# Patient Record
Sex: Male | Born: 1995 | Race: White | Hispanic: No | Marital: Single | Smoking: Never smoker
Health system: Southern US, Community
[De-identification: ages and names within clinical notes are randomized; demographics above are authoritative.]

## PROBLEM LIST (undated history)

## (undated) ENCOUNTER — Inpatient Hospital Stay: Admission: EM | Payer: Self-pay | Source: Home / Self Care

## (undated) HISTORY — PX: ADENOIDECTOMY: SUR15

## (undated) HISTORY — PX: CIRCUMCISION: SHX1350

## (undated) HISTORY — PX: TYMPANOSTOMY TUBE PLACEMENT: SHX32

## (undated) HISTORY — PX: TONSILLECTOMY: SUR1361

---
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
## 2016-02-06 DIAGNOSIS — M79671 Pain in right foot: Secondary | ICD-10-CM | POA: Diagnosis not present

## 2016-02-06 DIAGNOSIS — M25571 Pain in right ankle and joints of right foot: Secondary | ICD-10-CM | POA: Diagnosis not present

## 2016-07-23 MED FILL — PANTOPRAZOLE SOD DR 20 MG T: 20 | 90 days supply | Qty: 180 | Fill #0

## 2016-11-15 IMAGING — US US ABDOMEN COMPLETE
1 series · 14 of 25 positions shown · non-contrast
Comparison: None.

CLINICAL DATA: Patient with vomiting and abdominal pain for 2
weeks.

EXAM:
ULTRASOUND ABDOMEN COMPLETE

[Series 1: us abdomen complete · 0.15mm/px · 14 of 93 slices shown]
[im 1/93]
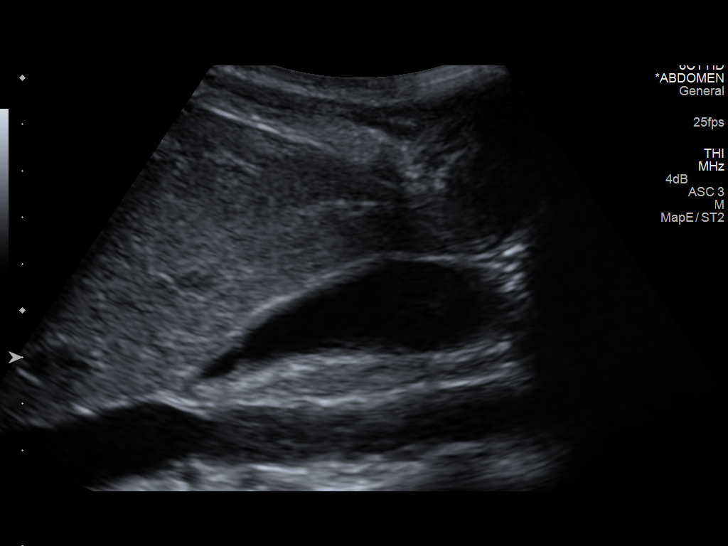
[im 8/93]
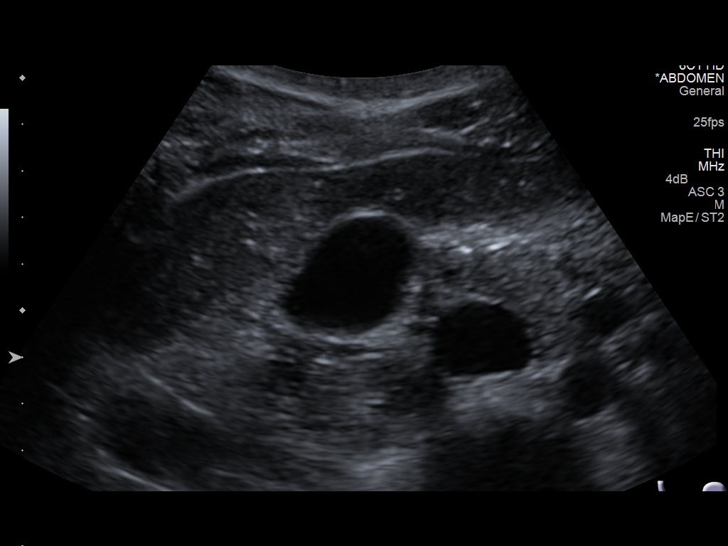
[im 16/93]
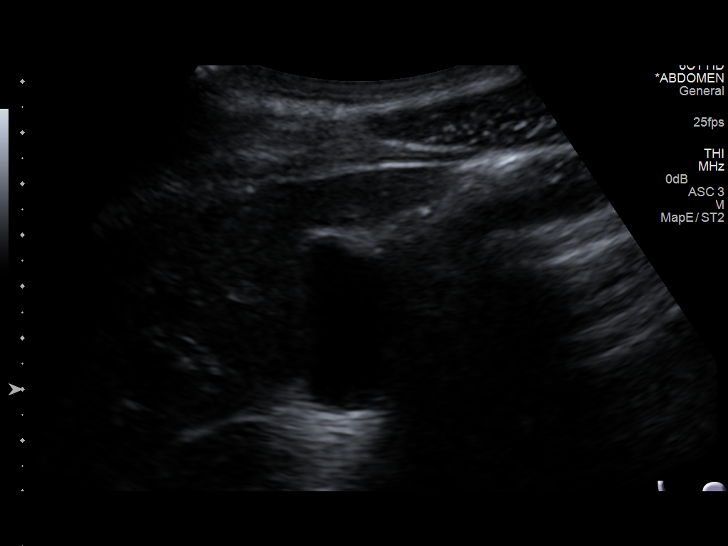
[im 24/93]
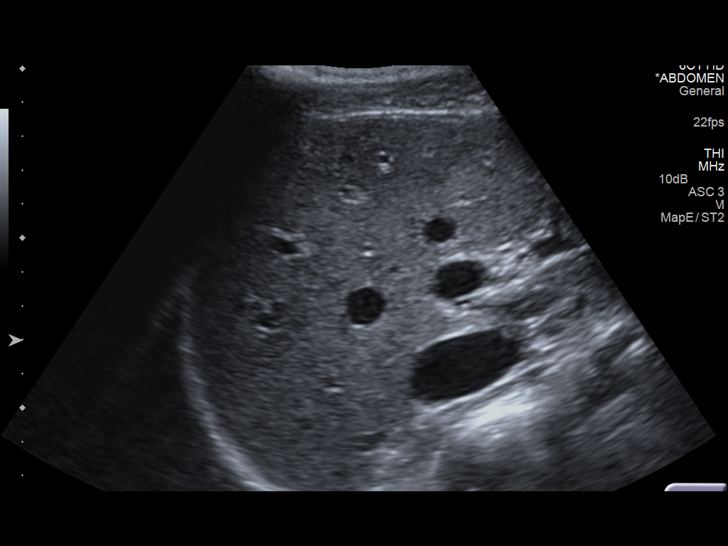
[im 31/93]
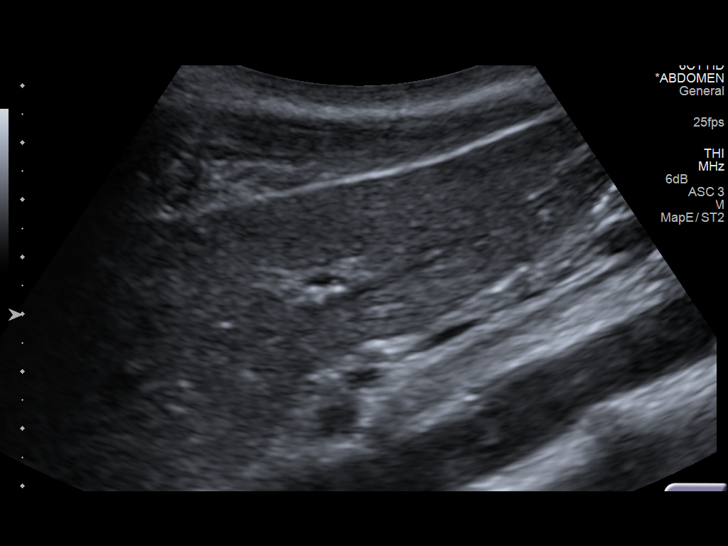
[im 35/93]
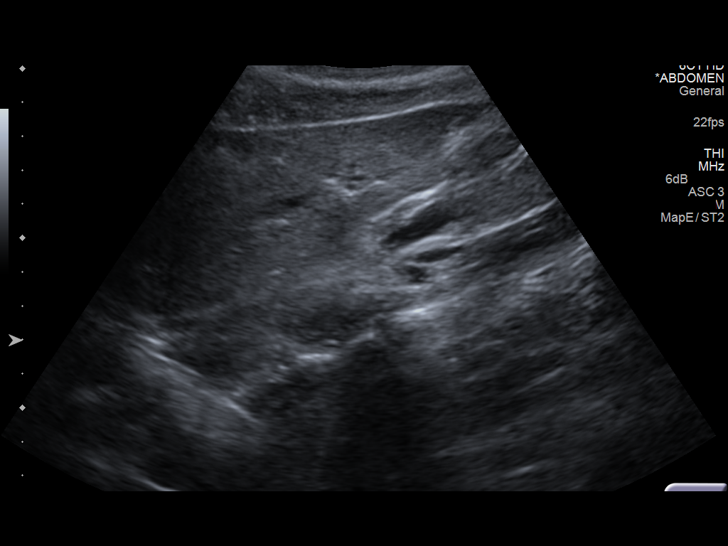
[im 43/93]
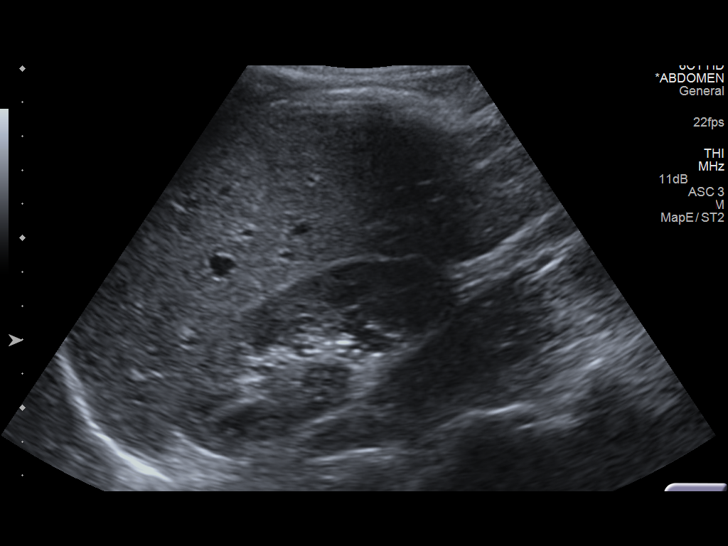
[im 50/93]
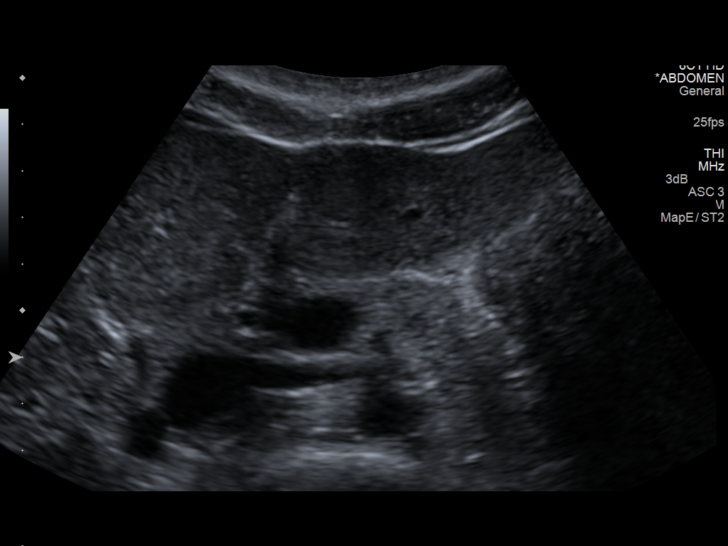
[im 58/93]
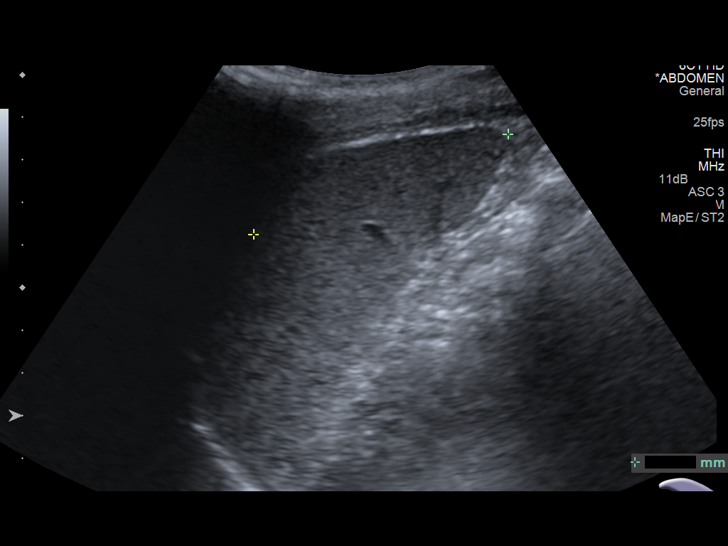
[im 62/93]
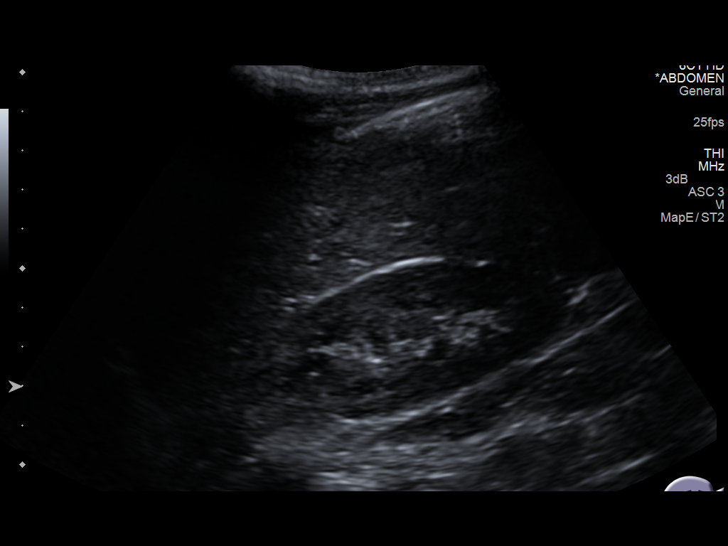
[im 70/93]
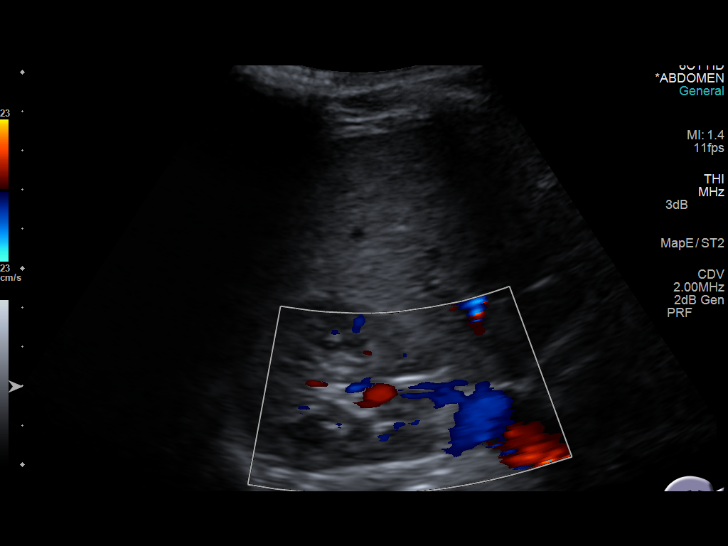
[im 77/93]
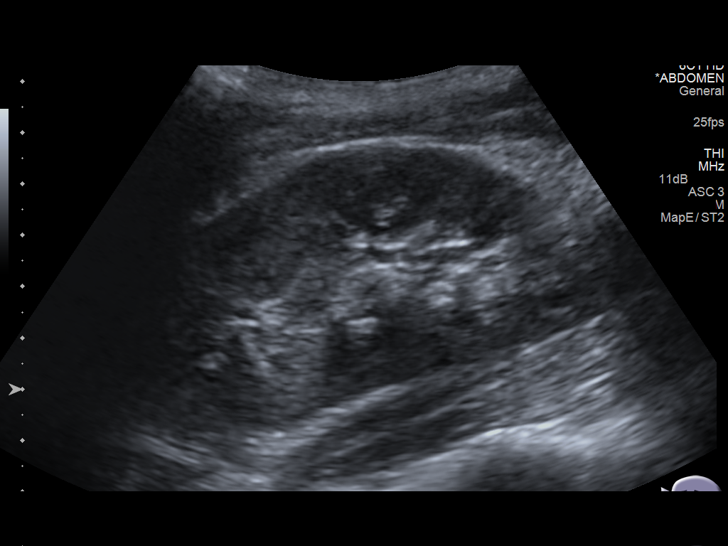
[im 85/93]
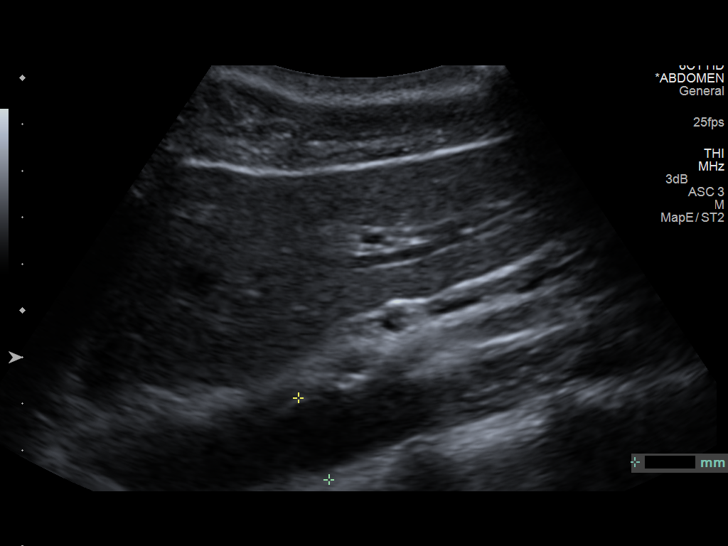
[im 93/93]
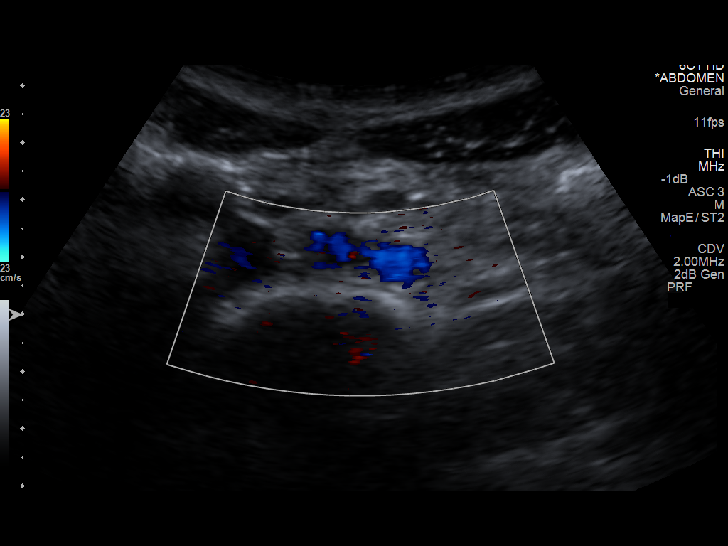

[14 of 25 positions shown; findings below may reference images not displayed]

FINDINGS: Gallbladder: No gallstones or wall thickening visualized. No
sonographic Murphy sign noted.

Common bile duct: Diameter: 1 mm

Liver: No focal lesion identified. Within normal limits in
parenchymal echogenicity.

IVC: No abnormality visualized.

Pancreas: Visualized portion unremarkable.

Spleen: Size and appearance within normal limits.

Right Kidney: Length: 10.3 cm. Echogenicity within normal limits. No
mass or hydronephrosis visualized.

Left Kidney: Length: 10.0 cm. Echogenicity within normal limits. No
mass or hydronephrosis visualized.

Abdominal aorta: No aneurysm visualized.

Other findings: None.
IMPRESSION: Unremarkable abdominal ultrasound.

## 2017-07-01 DIAGNOSIS — K219 Gastro-esophageal reflux disease without esophagitis: Secondary | ICD-10-CM | POA: Diagnosis not present

## 2017-07-01 DIAGNOSIS — Z0000 Encounter for general adult medical examination without abnormal findings: Secondary | ICD-10-CM | POA: Diagnosis not present

## 2018-11-11 DIAGNOSIS — M25522 Pain in left elbow: Secondary | ICD-10-CM | POA: Diagnosis not present

## 2018-11-30 DIAGNOSIS — Z23 Encounter for immunization: Secondary | ICD-10-CM | POA: Diagnosis not present

## 2018-11-30 DIAGNOSIS — Z789 Other specified health status: Secondary | ICD-10-CM | POA: Diagnosis not present

## 2019-05-09 DIAGNOSIS — Z23 Encounter for immunization: Secondary | ICD-10-CM | POA: Diagnosis not present

## 2020-04-23 DIAGNOSIS — R059 Cough, unspecified: Secondary | ICD-10-CM | POA: Diagnosis not present

## 2020-04-23 DIAGNOSIS — R0989 Other specified symptoms and signs involving the circulatory and respiratory systems: Secondary | ICD-10-CM | POA: Diagnosis not present

## 2020-04-23 DIAGNOSIS — J069 Acute upper respiratory infection, unspecified: Secondary | ICD-10-CM | POA: Diagnosis not present

## 2020-04-27 DIAGNOSIS — Z23 Encounter for immunization: Secondary | ICD-10-CM | POA: Diagnosis not present
